# Patient Record
Sex: Female | Born: 2005 | Race: White | Hispanic: No | Marital: Single | Smoking: Never smoker
Health system: Southern US, Community
[De-identification: ages and names within clinical notes are randomized; demographics above are authoritative.]

## PROBLEM LIST (undated history)

## (undated) HISTORY — PX: TONSILLECTOMY: SUR1361

---
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
## 2025-02-15 ENCOUNTER — Ambulatory Visit: Payer: Self-pay | Admitting: Neurology
